# Patient Record
Sex: Male | Born: 1990 | Race: Black or African American | Hispanic: No | Marital: Single | Smoking: Current every day smoker
Health system: Southern US, Community
[De-identification: ages and names within clinical notes are randomized; demographics above are authoritative.]

## PROBLEM LIST (undated history)

## (undated) DIAGNOSIS — T7840XA Allergy, unspecified, initial encounter: Secondary | ICD-10-CM

## (undated) HISTORY — PX: LEG SURGERY: SHX1003

## (undated) HISTORY — DX: Allergy, unspecified, initial encounter: T78.40XA

---
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
## 2012-05-04 ENCOUNTER — Encounter (HOSPITAL_COMMUNITY): Payer: Self-pay | Admitting: Emergency Medicine

## 2012-05-04 DIAGNOSIS — R51 Headache: Secondary | ICD-10-CM | POA: Insufficient documentation

## 2012-05-04 NOTE — ED Notes (Signed)
C/o headache x 4 days.  States he had sore throat 2 days ago but it resolved.

## 2012-05-05 ENCOUNTER — Emergency Department (HOSPITAL_COMMUNITY)
Admission: EM | Admit: 2012-05-05 | Discharge: 2012-05-05 | Disposition: A | Discharge status: Home / Self Care | Payer: 59 | Attending: Emergency Medicine | Admitting: Emergency Medicine

## 2012-05-05 DIAGNOSIS — R51 Headache: Secondary | ICD-10-CM

## 2012-05-05 MED ORDER — KETOROLAC TROMETHAMINE 30 MG/ML IJ SOLN
30.0000 mg | Freq: Once | INTRAMUSCULAR | Status: AC
  Administered 2012-05-05: 30 mg via INTRAVENOUS
  Filled 2012-05-05: qty 1

## 2012-05-05 MED ORDER — TRAMADOL HCL 50 MG PO TABS: 50.0000 mg | ORAL_TABLET | Freq: Three times a day (TID) | ORAL | Status: DC | PRN

## 2012-05-05 NOTE — ED Notes (Signed)
cbg done 75. md aware

## 2012-05-05 NOTE — ED Provider Notes (Signed)
History     CSN: [id number]  Arrival date & time 05/04/12  2109   First MD Initiated Contact with Patient 05/05/12 0046      Chief Complaint  Patient presents with  . Headache    (Consider location/radiation/quality/duration/timing/severity/associated sxs/prior treatment) Patient is a 22 y.o. male presenting with headaches. The history is provided by the patient (the pt complains of a headache today.  no vomiting or visual problems). No language interpreter was used.  Headache Pain location:  Frontal Quality:  Dull Severity currently:  4/10 Severity at highest:  6/10 Onset quality:  Gradual Timing:  Constant Progression:  Unchanged Chronicity:  New Similar to prior headaches: yes   Associated symptoms: no abdominal pain, no back pain, no congestion, no cough, no diarrhea, no fatigue, no seizures and no sinus pressure     History reviewed. No pertinent past medical history.  Past Surgical History  Procedure Laterality Date  . Leg surgery      No family history on file.  History  Substance Use Topics  . Smoking status: Never Smoker   . Smokeless tobacco: Not on file  . Alcohol Use: No      Review of Systems  Constitutional: Negative for fatigue.  HENT: Negative for congestion, sinus pressure and ear discharge.   Eyes: Negative for discharge.  Respiratory: Negative for cough.   Cardiovascular: Negative for chest pain.  Gastrointestinal: Negative for abdominal pain and diarrhea.  Genitourinary: Negative for frequency and hematuria.  Musculoskeletal: Negative for back pain.  Skin: Negative for rash.  Neurological: Positive for headaches. Negative for seizures.  Psychiatric/Behavioral: Negative for hallucinations.    Allergies  Review of patient's allergies indicates no known allergies.  Home Medications   Current Outpatient Rx  Name  Route  Sig  Dispense  Refill  . acetaminophen (TYLENOL) 325 MG tablet   Oral   Take 650 mg by mouth every 6 (six) hours  as needed for pain.         . traMADol (ULTRAM) 50 MG tablet   Oral   Take 1 tablet (50 mg total) by mouth every 8 (eight) hours as needed for pain (headache).   10 tablet   0     BP 160/72  Pulse 65  Temp(Src) 98.3 F (36.8 C) (Oral)  Resp 18  SpO2 98%  Physical Exam  Constitutional: He is oriented to person, place, and time. He appears well-developed.  HENT:  Head: Normocephalic and atraumatic.  Eyes: Conjunctivae and EOM are normal. No scleral icterus.  Neck: Neck supple. No thyromegaly present.  Cardiovascular: Normal rate and regular rhythm.  Exam reveals no gallop and no friction rub.   No murmur heard. Pulmonary/Chest: No stridor. He has no wheezes. He has no rales. He exhibits no tenderness.  Abdominal: He exhibits no distension. There is no tenderness. There is no rebound.  Musculoskeletal: Normal range of motion. He exhibits no edema.  Lymphadenopathy:    He has no cervical adenopathy.  Neurological: He is oriented to person, place, and time. Coordination normal.  Skin: No rash noted. No erythema.  Psychiatric: He has a normal mood and affect. His behavior is normal.    ED Course  Procedures (including critical care time)  Labs Reviewed  GLUCOSE, CAPILLARY   No results found.   1. Headache     Pt improved with tx  MDM          Benny Lennert, MD 05/05/12 [phone]

## 2012-05-05 NOTE — ED Notes (Signed)
PT DC TOHOME.  PT STATES UNDERSTANDING TO DC PAPERWORK.  PT AMBULATORY TO EXIT WITHOUT DIFFICULTY.  PT DENIES NEED FOR W/C.  PT STATES THAT HIS HA IS MUCH BETTER AND WILL FOLLOW UP WITH PCP.

## 2012-07-27 ENCOUNTER — Ambulatory Visit (INDEPENDENT_AMBULATORY_CARE_PROVIDER_SITE_OTHER): Payer: 59 | Admitting: Family Medicine

## 2012-07-27 ENCOUNTER — Encounter: Payer: Self-pay | Admitting: Family Medicine

## 2012-07-27 VITALS — BP 119/77 | HR 62 | Ht 70.0 in | Wt 148.0 lb

## 2012-07-27 DIAGNOSIS — Z7189 Other specified counseling: Secondary | ICD-10-CM

## 2012-07-27 DIAGNOSIS — J309 Allergic rhinitis, unspecified: Secondary | ICD-10-CM

## 2012-07-27 DIAGNOSIS — Z0000 Encounter for general adult medical examination without abnormal findings: Secondary | ICD-10-CM

## 2012-07-27 DIAGNOSIS — J302 Other seasonal allergic rhinitis: Secondary | ICD-10-CM | POA: Insufficient documentation

## 2012-07-27 MED ORDER — LORATADINE 10 MG PO TABS: 10.0000 mg | ORAL_TABLET | Freq: Every day | ORAL | Status: DC

## 2012-07-27 NOTE — Assessment & Plan Note (Signed)
Generally well-appearing male. Plan to f/u in 1 year or earlier as needed. Will f/u on possible feelings of anhedonia as needed. Doubt frank depression or other mood disorder, but will monitor.

## 2012-07-27 NOTE — Assessment & Plan Note (Signed)
A: Pt is a current most-days smoker, and interested in quitting. Not ready to set a definite quit-date. Denies illicit substance use.  P: Provided pt with 1-800-QUIT-NOW number. Will refer for [facility] health coaching. Will f/u as needed, if pt desires pharmaceutical assistance such as gums/patches, etc.

## 2012-07-27 NOTE — Addendum Note (Signed)
Addended by: Bobbye Morton on: 07/27/2012 05:15 PM   Modules accepted: Level of Service

## 2012-07-27 NOTE — Assessment & Plan Note (Signed)
Seasonal in nature, just starting to bother pt now that weather has changed. Recommended regular, daily use of Claritin as this has helped in the past and will likely not cause as much drowsiness as Benadryl. Will f/u as needed and consider addition of nasal saline or something like Flonase.

## 2012-07-27 NOTE — Patient Instructions (Signed)
Thank you for coming in, today! It was nice to meet you. For your allergies, start taking Claritin 10 mg every day.  It is a medicine that works better if you use it every day. You can use it only in the times of year when your allergies bother you.  If your allergies bother you for instance May through September, you can start taking them in April and stop in October. For your smoking, I'm glad you're interested in quitting.  You can call 1-800-QUIT-NOW (303-162-2927). This is the [facility] Stop Smoking Hotline, and is completely free.  I will also send a message to our "health coaches" that you are interested in quitting smoking.  The hotline people and our health coaches can call you and talk about different strategies that might help you quit.  If and when you need, you can make an appointment with me specifically about stopping smoking, as well. Otherwise, you can make an appointment to see me in a year for a well visit, or earlier with any problems as you need. Please feel free to call with any questions or concerns at any time, at [phone]. --Dr. Casper Harrison

## 2012-07-27 NOTE — Progress Notes (Signed)
  Subjective:    Patient ID: Troy Webb, male    DOB: [birth year], 22 y.o.   MRN: [id number]  HPI: Pt presents to clinic for a yearly physical and to discuss his allergies. Generally feels well, without complaints. Pt does not regularly take any medications and reports no known drug allergies.  Seasonal Allergies: Mostly manifests as stuffy nose, rhinorrhea, and sneezing, with some itchy eyes. Mostly in the late spring/summer. Helped by Benadryl (causes drowsiness) and Claritin, but pt does not take these regularly. Symptoms typically not bad in the mornings and then worse through the day as he is outside.  Of note, pt reports some occasional disinterest ("laziness"), in which he "puts off" things that he normally would be interested in. Pt however denies feelings of depression or frank anhedonia, and states he enjoys hobbies, activities with friends, etc without any change in his mood, recently.  PMH, surgical history, FH, SH all reviewed/updated as appropriate.   Pt is a current most-days smoker, ~3 Black-and-Mild's per day. Is interested in quitting but not ready to set a quit date.  Pt endorses weekly EtOH use, perhaps up to 6-7 beers per week, and up to 1 shot of liquor per week.  Pt is not currently sexually active, but has been in the past with male partners.  Review of Systems: As above. Otherwise denies N/V, chills/fever, headache, abdominal pain, change in bowel/bladder habits.     Objective:   Physical Exam BP 119/77  Pulse 62  Ht 5\' 10"  (1.778 m)  Wt 148 lb (67.132 kg)  BMI 21.24 kg/m2 Gen: well-appearing young adult male in NAD; slightly "sweaty," but states he was outside (very hot day) immediately before entering exam room HEENT: [place]/AT, PERRLA, EOMI, posterior oropharynx clear, sclerae and conjunctivae clear, TM's clear bilaterally  Thyroid not enlarged, no cervical lymphadenopathy  Small skin tear to right upper forehead (pt describes a recent burn from a splatter of  grease at work)  No evidence for cellulitis or infection Cardio: RRR, no rub/murmur Pulm: CTAB, no wheezes Abd: soft, nonender, BS+ Ext: warm, well-perfused, no cyanosis/clubbing/edema; distal pulses intact/symmetric     Assessment & Plan:

## 2012-08-09 ENCOUNTER — Telehealth: Payer: Self-pay | Admitting: Home Health Services

## 2012-08-09 NOTE — Telephone Encounter (Signed)
Spoke with Troy Webb Pt reports feeling: good Pt reports taking medications not asked   Smoking: yes, 3-4 black & mild packs per week for 5 years removing cigarettes and smoking materials from environment, stress management and substitution of other forms of reinforcement   This weeks goals: To go from smoking 3-4 black and mild cigars to just 1 a week.  Pt is interested in telephonic health coaching.  Pt's overall goal is: Quit smoking

## 2015-06-13 ENCOUNTER — Encounter (HOSPITAL_COMMUNITY): Payer: Self-pay

## 2015-06-13 ENCOUNTER — Emergency Department (HOSPITAL_COMMUNITY)
Admission: EM | Admit: 2015-06-13 | Discharge: 2015-06-13 | Disposition: A | Discharge status: Home / Self Care | Payer: 59 | Attending: Emergency Medicine | Admitting: Emergency Medicine

## 2015-06-13 DIAGNOSIS — Z79899 Other long term (current) drug therapy: Secondary | ICD-10-CM | POA: Insufficient documentation

## 2015-06-13 DIAGNOSIS — F17210 Nicotine dependence, cigarettes, uncomplicated: Secondary | ICD-10-CM | POA: Diagnosis not present

## 2015-06-13 DIAGNOSIS — J029 Acute pharyngitis, unspecified: Secondary | ICD-10-CM | POA: Diagnosis present

## 2015-06-13 DIAGNOSIS — J039 Acute tonsillitis, unspecified: Secondary | ICD-10-CM | POA: Insufficient documentation

## 2015-06-13 LAB — COMPREHENSIVE METABOLIC PANEL
ALT: 11 U/L — AB (ref 17–63)
ANION GAP: 12 (ref 5–15)
AST: 19 U/L (ref 15–41)
Albumin: 4 g/dL (ref 3.5–5.0)
Alkaline Phosphatase: 42 U/L (ref 38–126)
BUN: 13 mg/dL (ref 6–20)
CHLORIDE: 100 mmol/L — AB (ref 101–111)
CO2: 24 mmol/L (ref 22–32)
Calcium: 9 mg/dL (ref 8.9–10.3)
Creatinine, Ser: 1.27 mg/dL — ABNORMAL HIGH (ref 0.61–1.24)
GFR calc non Af Amer: >60 mL/min (ref >60)
Glucose, Bld: 117 mg/dL — ABNORMAL HIGH (ref 65–99)
POTASSIUM: 3.5 mmol/L (ref 3.5–5.1)
SODIUM: 136 mmol/L (ref 135–145)
Total Bilirubin: 0.9 mg/dL (ref 0.3–1.2)
Total Protein: 7.4 g/dL (ref 6.5–8.1)

## 2015-06-13 LAB — CBC
HCT: 41.5 % (ref 39.0–52.0)
Hemoglobin: 14.2 g/dL (ref 13.0–17.0)
MCH: 31.6 pg (ref 26.0–34.0)
MCHC: 34.2 g/dL (ref 30.0–36.0)
MCV: 92.2 fL (ref 78.0–100.0)
PLATELETS: 200 10*3/uL (ref 150–400)
RBC: 4.5 MIL/uL (ref 4.22–5.81)
RDW: 12.4 % (ref 11.5–15.5)
WBC: 12.4 10*3/uL — ABNORMAL HIGH (ref 4.0–10.5)

## 2015-06-13 LAB — RAPID STREP SCREEN (MED CTR MEBANE ONLY): Streptococcus, Group A Screen (Direct): NEGATIVE

## 2015-06-13 LAB — I-STAT CG4 LACTIC ACID, ED: Lactic Acid, Venous: 0.6 mmol/L (ref 0.5–2.0)

## 2015-06-13 MED ORDER — PENICILLIN V POTASSIUM 250 MG PO TABS
500.0000 mg | ORAL_TABLET | Freq: Once | ORAL | Status: AC
  Administered 2015-06-13: 500 mg via ORAL
  Filled 2015-06-13: qty 2

## 2015-06-13 MED ORDER — IBUPROFEN 800 MG PO TABS
800.0000 mg | ORAL_TABLET | Freq: Once | ORAL | Status: AC
  Administered 2015-06-13: 800 mg via ORAL
  Filled 2015-06-13: qty 1

## 2015-06-13 MED ORDER — PENICILLIN V POTASSIUM 500 MG PO TABS: 500.0000 mg | ORAL_TABLET | Freq: Two times a day (BID) | ORAL | Status: DC

## 2015-06-13 MED ORDER — ACETAMINOPHEN 325 MG PO TABS
ORAL_TABLET | ORAL | Status: AC
  Filled 2015-06-13: qty 2

## 2015-06-13 MED ORDER — ACETAMINOPHEN 325 MG PO TABS
650.0000 mg | ORAL_TABLET | Freq: Once | ORAL | Status: AC | PRN
  Administered 2015-06-13: 650 mg via ORAL

## 2015-06-13 MED ORDER — IBUPROFEN 800 MG PO TABS: 800.0000 mg | ORAL_TABLET | Freq: Three times a day (TID) | ORAL | Status: DC | PRN

## 2015-06-13 NOTE — ED Notes (Signed)
PT ambulatory and independent at discharge.  Wheeled out and driven home by significant other.

## 2015-06-13 NOTE — ED Provider Notes (Signed)
By signing my name below, I, Troy Webb, attest that this documentation has been prepared under the direction and in the presence of [employer], DO. Electronically Signed: Evon Webb, ED Scribe. 06/13/2015. 2:26 AM.  TIME SEEN: 2:20 AM   CHIEF COMPLAINT: Sore throat  HPI: HPI Comments: Troy Webb is a 25 y.o. male who presents to the Emergency Department complaining of sore throat onset 2 days prior. Pt reports associated fever. Pt doesn't report any medications PTA. Pt denies vomiting or diarrhea. Denies recent sick contacts. Has had a mild dry cough. No travel outside the country. No difficulty moving his neck. No headache.   ROS: See HPI Constitutional: no fever  Eyes: no drainage  ENT: no runny nose   Cardiovascular:  no chest pain  Resp: no SOB  GI: no vomiting GU: no dysuria Integumentary: no rash  Allergy: no hives  Musculoskeletal: no leg swelling  Neurological: no slurred speech ROS otherwise negative  PAST MEDICAL HISTORY/PAST SURGICAL HISTORY:  Past Medical History  Diagnosis Date  . Allergy     MEDICATIONS:  Prior to Admission medications   Medication Sig Start Date End Date Taking? Authorizing Provider  acetaminophen (TYLENOL) 325 MG tablet Take 650 mg by mouth every 6 (six) hours as needed for pain.    Historical Provider, MD  loratadine (CLARITIN) 10 MG tablet Take 1 tablet (10 mg total) by mouth daily. 07/27/12   Stephanie Coup Street, MD  traMADol (ULTRAM) 50 MG tablet Take 1 tablet (50 mg total) by mouth every 8 (eight) hours as needed for pain (headache). 05/05/12   Bethann Berkshire, MD    ALLERGIES:  No Known Allergies  SOCIAL HISTORY:  Social History  Substance Use Topics  . Smoking status: Current Every Day Smoker    Types: Cigars  . Smokeless tobacco: Never Used  . Alcohol Use: 4.8 oz/week    7 Cans of beer, 1 Shots of liquor per week     Comment: Beer, occasionally liquor    FAMILY HISTORY: Family History  Problem Relation Age  of Onset  . Diabetes Maternal Aunt   . Diabetes Cousin   . Sickle cell anemia Cousin   . Sickle cell trait Cousin     EXAM: BP 109/69 mmHg  Pulse 96  Temp(Src) 102.8 F (39.3 C) (Oral)  Resp 16  SpO2 99% CONSTITUTIONAL: Alert and oriented and responds appropriately to questions. Well-appearing; well-nourished, febrile HEAD: Normocephalic EYES: Conjunctivae clear, PERRL ENT: normal nose; no rhinorrhea; moist mucous membranes; patient has pharyngeal erythema without petechiae, bilateral mild tonsillar hypertrophy with exudate, no uvular deviation, no trismus or drooling, normal phonation, no stridor, no dental caries or abscess noted, no Ludwig's angina, tongue sits flat in the bottom of the mouth NECK: Supple, no meningismus, no LAD  CARD: RRR; S1 and S2 appreciated; no murmurs, no clicks, no rubs, no gallops RESP: Normal chest excursion without splinting or tachypnea; breath sounds clear and equal bilaterally; no wheezes, no rhonchi, no rales, no hypoxia or respiratory distress, speaking full sentences ABD/GI: Normal bowel sounds; non-distended; soft, non-tender, no rebound, no guarding, no peritoneal signs BACK:  The back appears normal and is non-tender to palpation, there is no CVA tenderness EXT: Normal ROM in all joints; non-tender to palpation; no edema; normal capillary refill; no cyanosis, no calf tenderness or swelling    SKIN: Normal color for age and race; warm; no rash NEURO: Moves all extremities equally, sensation to light touch intact diffusely, cranial nerves II through XII  intact PSYCH: The patient's mood and manner are appropriate. Grooming and personal hygiene are appropriate.  MEDICAL DECISION MAKING: Patient here with tonsillitis. History of test is negative but he has concerning exam. Will treat for bacterial causes. Have offered penicillin injection but he declined stating he would rather have pills. Will discharge on penicillin for the next 10 days. No significant  tonsillar hypertrophy on exam, posterior cervical lymphadenopathy. No sign of deep space neck infection, meningitis, pneumonia. No sign of peritonsillar abscess. Have her committed alternating Tylenol and Motrin. Recommended salt water gargles. Recommended rest and increase fluid intake.   At this time, I do not feel there is any life-threatening condition present. I feel the patient is safe to be discharged home. I have reviewed and discussed all results (EKG, imaging, lab, urine as appropriate), exam findings with patient. I have reviewed nursing notes and appropriate previous records.  I feel the patient is safe to be discharged home without further emergent workup. Discussed usual and customary return precautions. Patient and family (if present) verbalize understanding and are comfortable with this plan.  Patient will follow-up with their primary care provider. If they do not have a primary care provider, information for follow-up has been provided to them. All questions have been answered.   I personally performed the services described in this documentation, which was scribed in my presence. The recorded information has been reviewed and is accurate.    [name], DO 06/13/15 [phone]

## 2015-06-13 NOTE — ED Notes (Signed)
Pt here with c/o sore throat and painful swallowing, onset two days ago. He reports a cough "sometimes."

## 2015-06-13 NOTE — Discharge Instructions (Signed)

## 2015-06-15 LAB — CULTURE, GROUP A STREP (THRC)

## 2015-09-26 ENCOUNTER — Encounter (HOSPITAL_COMMUNITY): Payer: Self-pay | Admitting: Family Medicine

## 2015-09-26 ENCOUNTER — Ambulatory Visit (HOSPITAL_COMMUNITY)
Admission: EM | Admit: 2015-09-26 | Discharge: 2015-09-26 | Disposition: A | Discharge status: Home / Self Care | Payer: 59 | Attending: Radiology | Admitting: Radiology

## 2015-09-26 DIAGNOSIS — R109 Unspecified abdominal pain: Secondary | ICD-10-CM

## 2015-09-26 DIAGNOSIS — R112 Nausea with vomiting, unspecified: Secondary | ICD-10-CM | POA: Diagnosis not present

## 2015-09-26 MED ORDER — OMEPRAZOLE 20 MG PO CPDR: 20.0000 mg | DELAYED_RELEASE_CAPSULE | Freq: Every day | ORAL | Status: DC

## 2015-09-26 NOTE — ED Notes (Signed)
Pt here for mid ab pain with vomiting after drinking last night about 9pm. sts that he drank some water this am and held it down. Denies blood in vomit. sts vomit x 2

## 2015-09-26 NOTE — ED Provider Notes (Signed)
CSN: [id number]     Arrival date & time 09/26/15  1215 History   None    Chief Complaint  Patient presents with  . Abdominal Pain  . Emesis   (Consider location/radiation/quality/duration/timing/severity/associated sxs/prior Treatment) Patient is a 25 y.o. male presenting with abdominal pain and vomiting. The history is provided by the patient.  Abdominal Pain Pain location:  Generalized Pain quality: aching   Pain radiates to:  Does not radiate Pain severity:  Moderate Onset quality:  Sudden Duration:  1 day Timing:  Constant Progression:  Unchanged Chronicity:  Recurrent (X 1 year) Context: alcohol use   Relieved by: pepto bismol temperarily  Exacerbated by: ETOH and drinking soda. Associated symptoms: belching, nausea and vomiting (1 episode this morning)   Associated symptoms: no dysuria   Emesis Associated symptoms: abdominal pain     Past Medical History  Diagnosis Date  . Allergy    Past Surgical History  Procedure Laterality Date  . Leg surgery     Family History  Problem Relation Age of Onset  . Diabetes Maternal Aunt   . Diabetes Cousin   . Sickle cell anemia Cousin   . Sickle cell trait Cousin    Social History  Substance Use Topics  . Smoking status: Current Every Day Smoker    Types: Cigars  . Smokeless tobacco: Never Used  . Alcohol Use: 4.8 oz/week    7 Cans of beer, 1 Shots of liquor per week     Comment: Beer, occasionally liquor    Review of Systems  Constitutional: Negative.   HENT: Negative.   Respiratory: Negative.   Cardiovascular: Negative.   Gastrointestinal: Positive for nausea, vomiting (1 episode this morning) and abdominal pain. Negative for abdominal distention.       Last bowel movement on 7.7.17   Genitourinary: Negative for dysuria and difficulty urinating.    Allergies  Review of patient's allergies indicates no known allergies.  Home Medications   Prior to Admission medications   Medication Sig Start Date End Date  Taking? Authorizing Provider  acetaminophen (TYLENOL) 325 MG tablet Take 650 mg by mouth every 6 (six) hours as needed for pain.    Historical Provider, MD  ibuprofen (ADVIL,MOTRIN) 800 MG tablet Take 1 tablet (800 mg total) by mouth every 8 (eight) hours as needed for mild pain. 06/13/15   Kristen N Ward, DO  loratadine (CLARITIN) 10 MG tablet Take 1 tablet (10 mg total) by mouth daily. 07/27/12   Stephanie Coup Street, MD  penicillin v potassium (VEETID) 500 MG tablet Take 1 tablet (500 mg total) by mouth 2 (two) times daily. 06/13/15   Kristen N Ward, DO  traMADol (ULTRAM) 50 MG tablet Take 1 tablet (50 mg total) by mouth every 8 (eight) hours as needed for pain (headache). 05/05/12   Bethann Berkshire, MD   Meds Ordered and Administered this Visit  Medications - No data to display  BP 132/88 mmHg  Pulse 78  Temp(Src) 98.1 F (36.7 C)  Resp 18  SpO2 98% No data found.   Physical Exam  Constitutional: He appears well-developed and well-nourished.  Cardiovascular: Normal rate and regular rhythm.   Pulmonary/Chest: Effort normal and breath sounds normal.  Abdominal: Soft. Bowel sounds are normal. He exhibits no distension. There is tenderness.  Patient presents with intermittent pain X 1 year. Patient describes the pain as achingPatient reports nausea and 1 episode of vomitting today. Condition is acute on chronic. Condition is made better by pepto bismol and worse  with etoh and soda.     ED Course  Procedures (including critical care time)  Labs Review Labs Reviewed - No data to display  Imaging Review No results found.   Visual Acuity Review  Right Eye Distance:   Left Eye Distance:   Bilateral Distance:    Right Eye Near:   Left Eye Near:    Bilateral Near:         MDM  No diagnosis found. Patient instructed to take 14 days of prilosec and limit known causes is possible   Alene MiresJennifer C Rahaf Carbonell, NP 09/26/15 1308

## 2015-10-02 ENCOUNTER — Ambulatory Visit (HOSPITAL_COMMUNITY)
Admission: EM | Admit: 2015-10-02 | Discharge: 2015-10-02 | Disposition: A | Discharge status: Home / Self Care | Payer: 59 | Attending: Internal Medicine | Admitting: Internal Medicine

## 2015-10-02 ENCOUNTER — Ambulatory Visit (INDEPENDENT_AMBULATORY_CARE_PROVIDER_SITE_OTHER): Payer: 59

## 2015-10-02 ENCOUNTER — Encounter (HOSPITAL_COMMUNITY): Payer: Self-pay | Admitting: Emergency Medicine

## 2015-10-02 DIAGNOSIS — T148 Other injury of unspecified body region: Secondary | ICD-10-CM

## 2015-10-02 DIAGNOSIS — T148XXA Other injury of unspecified body region, initial encounter: Secondary | ICD-10-CM

## 2015-10-02 MED ORDER — IBUPROFEN 800 MG PO TABS
800.0000 mg | ORAL_TABLET | Freq: Three times a day (TID) | ORAL | Status: DC
Start: 2015-10-02 — End: 2016-05-22

## 2015-10-02 MED ORDER — IBUPROFEN 800 MG PO TABS: 800.0000 mg | ORAL_TABLET | Freq: Three times a day (TID) | ORAL | Status: DC

## 2015-10-02 NOTE — ED Provider Notes (Signed)
CSN: [id number]     Arrival date & time 10/02/15  1001 History   First MD Initiated Contact with Patient 10/02/15 1040     Chief Complaint  Patient presents with  . Arm Pain   (Consider location/radiation/quality/duration/timing/severity/associated sxs/prior Treatment) Patient is a 25 y.o. male presenting with arm pain. The history is provided by the patient. No language interpreter was used.  Arm Pain This is a new problem. The current episode started 2 days ago. The problem occurs constantly. The problem has been gradually worsening. Associated symptoms include chest pain. Nothing aggravates the symptoms. He has tried nothing for the symptoms. The treatment provided no relief.  Pt complains of pain in his left side. Pt reports pain is worse when he moves.  Past Medical History  Diagnosis Date  . Allergy    Past Surgical History  Procedure Laterality Date  . Leg surgery     Family History  Problem Relation Age of Onset  . Diabetes Maternal Aunt   . Diabetes Cousin   . Sickle cell anemia Cousin   . Sickle cell trait Cousin    Social History  Substance Use Topics  . Smoking status: Current Every Day Smoker    Types: Cigars  . Smokeless tobacco: Never Used  . Alcohol Use: 4.8 oz/week    7 Cans of beer, 1 Shots of liquor per week     Comment: Beer, occasionally liquor    Review of Systems  Cardiovascular: Positive for chest pain.  All other systems reviewed and are negative.   Allergies  Review of patient's allergies indicates no known allergies.  Home Medications   Prior to Admission medications   Medication Sig Start Date End Date Taking? Authorizing Provider  omeprazole (PRILOSEC) 20 MG capsule Take 1 capsule (20 mg total) by mouth daily. 09/26/15  Yes Alene Mires, NP  acetaminophen (TYLENOL) 325 MG tablet Take 650 mg by mouth every 6 (six) hours as needed for pain.    Historical Provider, MD  ibuprofen (ADVIL,MOTRIN) 800 MG tablet Take 1 tablet (800 mg  total) by mouth 3 (three) times daily. 10/02/15   Elson Areas, PA-C  loratadine (CLARITIN) 10 MG tablet Take 1 tablet (10 mg total) by mouth daily. 07/27/12   Stephanie Coup Street, MD  penicillin v potassium (VEETID) 500 MG tablet Take 1 tablet (500 mg total) by mouth 2 (two) times daily. 06/13/15   Kristen N Ward, DO  traMADol (ULTRAM) 50 MG tablet Take 1 tablet (50 mg total) by mouth every 8 (eight) hours as needed for pain (headache). 05/05/12   Bethann Berkshire, MD   Meds Ordered and Administered this Visit  Medications - No data to display  BP 132/75 mmHg  Pulse 55  Temp(Src) 98 F (36.7 C) (Oral)  Resp 16  SpO2 100% No data found.   Physical Exam  Constitutional: He is oriented to person, place, and time. He appears well-developed and well-nourished.  HENT:  Head: Normocephalic and atraumatic.  Right Ear: External ear normal.  Eyes: Conjunctivae and EOM are normal.  Neck: Normal range of motion.  Cardiovascular: Normal rate.   Pulmonary/Chest: Effort normal.  Tender left lateral chest wall.  Pain with movement.   Abdominal: He exhibits no distension.  Musculoskeletal: Normal range of motion.  Neurological: He is alert and oriented to person, place, and time.  Psychiatric: He has a normal mood and affect.  Nursing note and vitals reviewed.   ED Course  Procedures (including critical care time)  Labs Review Labs Reviewed - No data to display  Imaging Review Dg Chest 2 View  10/02/2015  CLINICAL DATA:  Left-sided chest pain under the axilla. EXAM: CHEST  2 VIEW COMPARISON:  None. FINDINGS: The heart size and mediastinal contours are within normal limits. Both lungs are clear. The visualized skeletal structures are unremarkable. IMPRESSION: No active cardiopulmonary disease. Electronically Signed   By: Elige KoHetal  Patel   On: 10/02/2015 11:38     Visual Acuity Review  Right Eye Distance:   Left Eye Distance:   Bilateral Distance:    Right Eye Near:   Left Eye Near:     Bilateral Near:         MDM Pain seems to be muscular.  Pt advised to return if not improving.  Ibuprofen oow until Monday   1. Muscle strain    Meds ordered this encounter  Medications  . DISCONTD: ibuprofen (ADVIL,MOTRIN) 800 MG tablet    Sig: Take 1 tablet (800 mg total) by mouth 3 (three) times daily.    Dispense:  21 tablet    Refill:  0    Order Specific Question:  Supervising Provider    Answer:  Eustace MooreMURRAY, LAURA W [[id number]]  . ibuprofen (ADVIL,MOTRIN) 800 MG tablet    Sig: Take 1 tablet (800 mg total) by mouth 3 (three) times daily.    Dispense:  21 tablet    Refill:  0    Order Specific Question:  Supervising Provider    Answer:  Eustace MooreMURRAY, LAURA W [[id number]]  An After Visit Summary was printed and given to the patient.    Lonia SkinnerLeslie K BerlinSofia, PA-C 10/02/15 1203

## 2015-10-02 NOTE — ED Notes (Signed)
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 NAD

## 2015-10-02 NOTE — Discharge Instructions (Signed)

## 2016-05-22 ENCOUNTER — Emergency Department (INDEPENDENT_AMBULATORY_CARE_PROVIDER_SITE_OTHER)
Admission: EM | Admit: 2016-05-22 | Discharge: 2016-05-22 | Disposition: A | Discharge status: Home / Self Care | Payer: 59 | Source: Home / Self Care | Attending: Family Medicine | Admitting: Family Medicine

## 2016-05-22 ENCOUNTER — Emergency Department (INDEPENDENT_AMBULATORY_CARE_PROVIDER_SITE_OTHER): Payer: 59

## 2016-05-22 ENCOUNTER — Encounter: Payer: Self-pay | Admitting: Emergency Medicine

## 2016-05-22 DIAGNOSIS — M79671 Pain in right foot: Secondary | ICD-10-CM

## 2016-05-22 DIAGNOSIS — S93601A Unspecified sprain of right foot, initial encounter: Secondary | ICD-10-CM

## 2016-05-22 DIAGNOSIS — M25471 Effusion, right ankle: Secondary | ICD-10-CM

## 2016-05-22 MED ORDER — HYDROCODONE-ACETAMINOPHEN 5-325 MG PO TABS: ORAL_TABLET | ORAL | 0 refills | Status: DC

## 2016-05-22 NOTE — ED Provider Notes (Signed)
[facility]    CSN: [id number] Arrival date & time: 05/22/16  1640     History   Chief Complaint Chief Complaint  Patient presents with  . Foot Injury    HPI Troy Webb is a 26 y.o. male.   Patient stepped off a curb yesterday, twisting his right foot/ankle.  He has had persistent pain with weight bearing.   The history is provided by the patient.  Foot Injury  Location:  Foot Time since incident:  1 day Injury: yes   Mechanism of injury comment:  Twisted foot stepping off a curb Foot location:  Dorsum of R foot Pain details:    Quality:  Aching   Radiates to:  Does not radiate   Severity:  Moderate   Onset quality:  Sudden   Duration:  1 day   Timing:  Constant   Progression:  Unchanged Chronicity:  New Prior injury to area:  No Relieved by:  Nothing Worsened by:  Bearing weight Ineffective treatments:  None tried Associated symptoms: decreased ROM, stiffness and swelling   Associated symptoms: no muscle weakness, no numbness and no tingling     Past Medical History:  Diagnosis Date  . Allergy     Patient Active Problem List   Diagnosis Date Noted  . Health maintenance examination 07/27/2012  . Counseling on substance use and abuse 07/27/2012  . Seasonal allergies 07/27/2012    Past Surgical History:  Procedure Laterality Date  . LEG SURGERY         Home Medications    Prior to Admission medications   Medication Sig Start Date End Date Taking? Authorizing Provider  acetaminophen (TYLENOL) 325 MG tablet Take 650 mg by mouth every 6 (six) hours as needed for pain.    Historical Provider, MD  HYDROcodone-acetaminophen (NORCO/VICODIN) 5-325 MG tablet Take one by mouth at bedtime as needed for pain 05/22/16   Lattie Haw, MD    Family History Family History  Problem Relation Age of Onset  . Diabetes Maternal Aunt   . Diabetes Cousin   . Sickle cell anemia Cousin   . Sickle cell trait Cousin     Social History Social  History  Substance Use Topics  . Smoking status: Current Every Day Smoker    Types: Cigars  . Smokeless tobacco: Never Used  . Alcohol use 4.8 oz/week    7 Cans of beer, 1 Shots of liquor per week     Comment: Beer, occasionally liquor     Allergies   Patient has no known allergies.   Review of Systems Review of Systems  Musculoskeletal: Positive for stiffness.  All other systems reviewed and are negative.    Physical Exam Triage Vital Signs ED Triage Vitals  Enc Vitals Group     BP 05/22/16 1650 126/89     Pulse Rate 05/22/16 1650 83     Resp --      Temp 05/22/16 1650 98.5 F (36.9 C)     Temp Source 05/22/16 1650 Oral     SpO2 05/22/16 1650 98 %     Weight 05/22/16 1650 145 lb (65.8 kg)     Height 05/22/16 1650 6' (1.829 m)     Head Circumference --      Peak Flow --      Pain Score 05/22/16 1652 8     Pain Loc --      Pain Edu? --      Excl. in GC? --  No data found.   Updated Vital Signs BP 126/89 (BP Location: Left Arm)   Pulse 83   Temp 98.5 F (36.9 C) (Oral)   Ht 6' (1.829 m)   Wt 145 lb (65.8 kg)   SpO2 98%   BMI 19.67 kg/m   Visual Acuity Right Eye Distance:   Left Eye Distance:   Bilateral Distance:    Right Eye Near:   Left Eye Near:    Bilateral Near:     Physical Exam  Constitutional: He appears well-developed and well-nourished. No distress.  HENT:  Head: Atraumatic.  Eyes: Pupils are equal, round, and reactive to light.  Neck: Normal range of motion.  Cardiovascular: Normal rate.   Pulmonary/Chest: Effort normal.  Musculoskeletal:       Right foot: There is decreased range of motion, tenderness, bony tenderness and swelling. There is normal capillary refill.       Feet:   Right ankle:  Decreased range of motion.  Tenderness and swelling over the dorsal/lateral foot  as noted on diagram.  There is tenderness over the medial ankle just anterior to the medial malleolus.  Ankle joint stable.  No tenderness over the base of  the fifth metatarsal.  Distal neurovascular function is intact.   Neurological: He is alert.  Skin: Skin is warm and dry.  Nursing note and vitals reviewed.    UC Treatments / Results  Labs (all labs ordered are listed, but only abnormal results are displayed) Labs Reviewed - No data to display  EKG  EKG Interpretation None       Radiology Dg Ankle Complete Right  Result Date: 05/22/2016 CLINICAL DATA:  Pt states he stepped off a curb last night and twisted his right foot/right ankle. C/o anterior and lateral pain with swelling and is unable to bear weight. EXAM: RIGHT ANKLE - COMPLETE 3+ VIEW COMPARISON:  None. FINDINGS: Osseous alignment is normal. No fracture line or displaced fracture fragment. Ankle mortise is symmetric. Soft tissue swelling about the right ankle. IMPRESSION: Soft tissue swelling.  No osseous fracture or dislocation seen. Electronically Signed   By: Bary RichardStan  Maynard M.D.   On: 05/22/2016 17:20   Dg Foot Complete Right  Result Date: 05/22/2016 CLINICAL DATA:  Pt states he stepped off a curb last night and twisted his right foot/right ankle. C/o anterior and lateral pain with swelling and is unable to bear weight. EXAM: RIGHT FOOT COMPLETE - 3+ VIEW COMPARISON:  None. FINDINGS: There is no evidence of fracture or dislocation. There is no evidence of arthropathy or other focal bone abnormality. Soft tissues are unremarkable. IMPRESSION: Negative. Electronically Signed   By: Bary RichardStan  Maynard M.D.   On: 05/22/2016 17:19    Procedures Procedures (including critical care time)  Medications Ordered in UC Medications - No data to display   Initial Impression / Assessment and Plan / UC Course  I have reviewed the triage vital signs and the nursing notes.  Pertinent labs & imaging results that were available during my care of the patient were reviewed by me and considered in my medical decision making (see chart for details).    Ace wrap applied.  Dispensed cam walker and  crutches.  Rx for Lortab at bedtime. Apply ice pack for 30 minutes every 1 to 2 hours today and tomorrow.  Elevate.  Use crutches for 3 to 5 days.  Wear Ace wrap until swelling decreases.  Wear brace for about 2 to 3 weeks.  Begin range of motion and  stretching exercises in about 5 days as per instruction sheet.  May take Ibuprofen 200mg , 4 tabs every 8 hours with food.  Followup with Dr. Rodney Langton or Dr. Clement[facility] ([facility]) if not improving about two weeks.     Final Clinical Impressions(s) / UC Diagnoses   Final diagnoses:  Right foot sprain, initial encounter    New Prescriptions New Prescriptions   HYDROCODONE-ACETAMINOPHEN (NORCO/VICODIN) 5-325 MG TABLET    Take one by mouth at bedtime as needed for pain     Lattie Haw, MD 05/24/16 1719

## 2016-05-22 NOTE — Discharge Instructions (Signed)
Apply ice pack for 30 minutes every 1 to 2 hours today and tomorrow.  Elevate.  Use crutches for 3 to 5 days.  Wear Ace wrap until swelling decreases.  Wear brace for about 2 to 3 weeks.  Begin range of motion and stretching exercises in about 5 days as per instruction sheet.  May take Ibuprofen 200mg, 4 tabs every 8 hours with food.  ° °

## 2016-05-22 NOTE — ED Triage Notes (Signed)
Pt injured his right foot and ankle yesterday.  Pt stepped off of a curb, pain and swelling.

## 2018-12-11 ENCOUNTER — Emergency Department (HOSPITAL_COMMUNITY): Payer: 59

## 2018-12-11 ENCOUNTER — Encounter (HOSPITAL_COMMUNITY): Payer: Self-pay | Admitting: Emergency Medicine

## 2018-12-11 ENCOUNTER — Emergency Department (HOSPITAL_COMMUNITY)
Admission: EM | Admit: 2018-12-11 | Discharge: 2018-12-11 | Disposition: A | Discharge status: Home / Self Care | Payer: 59 | Attending: Emergency Medicine | Admitting: Emergency Medicine

## 2018-12-11 DIAGNOSIS — F17200 Nicotine dependence, unspecified, uncomplicated: Secondary | ICD-10-CM | POA: Diagnosis not present

## 2018-12-11 DIAGNOSIS — R0789 Other chest pain: Secondary | ICD-10-CM | POA: Diagnosis not present

## 2018-12-11 DIAGNOSIS — R079 Chest pain, unspecified: Secondary | ICD-10-CM | POA: Diagnosis present

## 2018-12-11 LAB — TROPONIN I (HIGH SENSITIVITY)
Troponin I (High Sensitivity): 5 ng/L (ref <18)
Troponin I (High Sensitivity): 6 ng/L (ref <18)

## 2018-12-11 LAB — CBC
HCT: 42.6 % (ref 39.0–52.0)
Hemoglobin: 14.2 g/dL (ref 13.0–17.0)
MCH: 32.4 pg (ref 26.0–34.0)
MCHC: 33.3 g/dL (ref 30.0–36.0)
MCV: 97.3 fL (ref 80.0–100.0)
Platelets: 240 10*3/uL (ref 150–400)
RBC: 4.38 MIL/uL (ref 4.22–5.81)
RDW: 12.6 % (ref 11.5–15.5)
WBC: 7.5 10*3/uL (ref 4.0–10.5)
nRBC: 0 % (ref 0.0–0.2)

## 2018-12-11 LAB — BASIC METABOLIC PANEL
Anion gap: 9 (ref 5–15)
BUN: 15 mg/dL (ref 6–20)
CO2: 26 mmol/L (ref 22–32)
Calcium: 9.3 mg/dL (ref 8.9–10.3)
Chloride: 105 mmol/L (ref 98–111)
Creatinine, Ser: 1.13 mg/dL (ref 0.61–1.24)
GFR calc Af Amer: >60 mL/min (ref >60)
GFR calc non Af Amer: >60 mL/min (ref >60)
Glucose, Bld: 83 mg/dL (ref 70–99)
Potassium: 4.1 mmol/L (ref 3.5–5.1)
Sodium: 140 mmol/L (ref 135–145)

## 2018-12-11 MED ORDER — SODIUM CHLORIDE 0.9% FLUSH: 3.0000 mL | Freq: Once | INTRAVENOUS | Status: DC

## 2018-12-11 NOTE — Discharge Instructions (Signed)
Take 4 over the counter ibuprofen tablets 3 times a day or 2 over-the-counter naproxen tablets twice a day for pain. Also take tylenol 1000mg(2 extra strength) four times a day.    

## 2018-12-11 NOTE — ED Provider Notes (Signed)
[facility] Provider Note   CSN: [id number] Arrival date & time: 12/11/18  1308     History   Chief Complaint No chief complaint on file.   HPI WILL HEINKEL is a 28 y.o. male.     28 yo M with a chief complaints of chest pain.  This is left-sided and pinpoint.  Going on for the past couple days.  Is worse with deep breathing twisting turning or lifting things over his head.  He denies trauma to the area denies history of PE or DVT denies hemoptysis, unilateral lower extremity edema recent surgery or immobilization.  He denies hypertension hyperlipidemia diabetes or family history of MI.  He does smoke occasionally.  Describes the pain is sharp nonradiating.  The history is provided by the patient.  Chest Pain Pain location:  L chest Pain quality: sharp and shooting   Pain radiates to:  Does not radiate Pain severity:  Moderate Onset quality:  Gradual Duration:  2 days Timing:  Constant Progression:  Worsening Chronicity:  New Relieved by:  Nothing Worsened by:  Deep breathing and movement Ineffective treatments:  None tried Associated symptoms: no abdominal pain, no fever, no headache, no palpitations, no shortness of breath and no vomiting     Past Medical History:  Diagnosis Date  . Allergy     Patient Active Problem List   Diagnosis Date Noted  . Health maintenance examination 07/27/2012  . Counseling on substance use and abuse 07/27/2012  . Seasonal allergies 07/27/2012    Past Surgical History:  Procedure Laterality Date  . LEG SURGERY          Home Medications    Prior to Admission medications   Medication Sig Start Date End Date Taking? Authorizing Provider  acetaminophen (TYLENOL) 325 MG tablet Take 650 mg by mouth every 6 (six) hours as needed for pain.    [provider]  HYDROcodone-acetaminophen (NORCO/VICODIN) 5-325 MG tablet Take one by mouth at bedtime as needed for pain 05/22/16   Kandra Nicolas, MD    Family History Family History  Problem Relation Age of Onset  . Diabetes Maternal Aunt   . Diabetes Cousin   . Sickle cell anemia Cousin   . Sickle cell trait Cousin     Social History Social History   Tobacco Use  . Smoking status: Current Every Day Smoker    Types: Cigars  . Smokeless tobacco: Never Used  Substance Use Topics  . Alcohol use: Yes    Alcohol/week: 8.0 standard drinks    Types: 7 Cans of beer, 1 Shots of liquor per week    Comment: Beer, occasionally liquor  . Drug use: No     Allergies   Patient has no known allergies.   Review of Systems Review of Systems  Constitutional: Negative for chills and fever.  HENT: Negative for congestion and facial swelling.   Eyes: Negative for discharge and visual disturbance.  Respiratory: Negative for shortness of breath.   Cardiovascular: Positive for chest pain. Negative for palpitations.  Gastrointestinal: Negative for abdominal pain, diarrhea and vomiting.  Musculoskeletal: Negative for arthralgias and myalgias.  Skin: Negative for color change and rash.  Neurological: Negative for tremors, syncope and headaches.  Psychiatric/Behavioral: Negative for confusion and dysphoric mood.     Physical Exam Updated Vital Signs BP 124/80 (BP Location: Left Arm)   Pulse 80   Temp 98.5 F (36.9 C) (Oral)   Resp 16   SpO2  98%   Physical Exam Vitals signs and nursing note reviewed.  Constitutional:      Appearance: He is well-developed.  HENT:     Head: Normocephalic and atraumatic.  Eyes:     Pupils: Pupils are equal, round, and reactive to light.  Neck:     Musculoskeletal: Normal range of motion and neck supple.     Vascular: No JVD.  Cardiovascular:     Rate and Rhythm: Normal rate and regular rhythm.     Heart sounds: No murmur. No friction rub. No gallop.   Pulmonary:     Effort: No respiratory distress.     Breath sounds: No wheezing.  Abdominal:     General: There is no  distension.     Tenderness: There is no guarding or rebound.  Musculoskeletal: Normal range of motion.        General: Tenderness present.     Comments: Pain is reproduced with palpation of the left chest wall.  Skin:    Coloration: Skin is not pale.     Findings: No rash.  Neurological:     Mental Status: He is alert and oriented to person, place, and time.  Psychiatric:        Behavior: Behavior normal.      ED Treatments / Results  Labs (all labs ordered are listed, but only abnormal results are displayed) Labs Reviewed  BASIC METABOLIC PANEL  CBC  TROPONIN I (HIGH SENSITIVITY)  TROPONIN I (HIGH SENSITIVITY)    EKG EKG Interpretation  Date/Time:  Tuesday December 11 2018 13:53:11 EDT Ventricular Rate:  59 PR Interval:  146 QRS Duration: 88 QT Interval:  382 QTC Calculation: 378 R Axis:   90 Text Interpretation:   Poor data quality, interpretation may be adversely affected Sinus bradycardia Rightward axis Pulmonary disease pattern Abnormal ECG No old tracing to compare Confirmed by Melene Plan [phone]) on 12/11/2018 6:16:29 PM   Radiology Dg Chest 2 View  Result Date: 12/11/2018 CLINICAL DATA:  Chest pain and shortness of breath EXAM: CHEST - 2 VIEW COMPARISON:  October 02, 2015 FINDINGS: The lungs are clear. Heart size and pulmonary vascularity are normal. No adenopathy. No pneumothorax. No bone lesions. IMPRESSION: No edema or consolidation. Electronically Signed   By: Bretta Bang III M.D.   On: 12/11/2018 14:54    Procedures Procedures (including critical care time)  Medications Ordered in ED Medications  sodium chloride flush (NS) 0.9 % injection 3 mL (has no administration in time range)     Initial Impression / Assessment and Plan / ED Course  I have reviewed the triage vital signs and the nursing notes.  Pertinent labs & imaging results that were available during my care of the patient were reviewed by me and considered in my medical decision making  (see chart for details).        28 yo M with chest pain.  Atypical in nature.  Reproduced on exam.  Most likely this is musculoskeletal.  EKG with no concerning finding for me.  Had lab work performed in triage that is unremarkable.  Not anemic no electrolyte abnormality his troponin is negative.  Chest x-ray viewed by me without focal overtreated or pneumothorax.  At this point his pain is most likely musculoskeletal.  Will discharge patient home.  6:51 PM:  I have discussed the diagnosis/risks/treatment options with the patient and friend and believe the pt to be eligible for discharge home to follow-up with PCP. We also discussed returning to the  ED immediately if new or worsening sx occur. We discussed the sx which are most concerning (e.g., sudden worsening pain, fever, inability to tolerate by mouth, syncope) that necessitate immediate return. Medications administered to the patient during their visit and any new prescriptions provided to the patient are listed below.  Medications given during this visit Medications  sodium chloride flush (NS) 0.9 % injection 3 mL (has no administration in time range)     The patient appears reasonably screen and/or stabilized for discharge and I doubt any other medical condition or other [facility] requiring further screening, evaluation, or treatment in the ED at this time prior to discharge.    Final Clinical Impressions(s) / ED Diagnoses   Final diagnoses:  Chest wall pain    ED Discharge Orders    None       Melene Plan, DO 12/11/18 1851

## 2018-12-11 NOTE — ED Notes (Signed)
Patient verbalizes understanding of discharge instructions. Opportunity for questioning and answers were provided. Armband removed by staff, pt discharged from ED.  

## 2018-12-11 NOTE — ED Triage Notes (Signed)
Pt sent [facility] from [facility] with [facility] abnormal ekg , pt is c/o chest pain on the left side non radiating , pt is a smoker

## 2019-04-16 ENCOUNTER — Encounter: Payer: Self-pay | Admitting: Emergency Medicine

## 2019-04-16 ENCOUNTER — Ambulatory Visit
Admission: EM | Admit: 2019-04-16 | Discharge: 2019-04-16 | Disposition: A | Discharge status: Home / Self Care | Payer: 59 | Attending: Emergency Medicine | Admitting: Emergency Medicine

## 2019-04-16 ENCOUNTER — Other Ambulatory Visit: Payer: Self-pay

## 2019-04-16 DIAGNOSIS — H60312 Diffuse otitis externa, left ear: Secondary | ICD-10-CM | POA: Diagnosis not present

## 2019-04-16 DIAGNOSIS — R0982 Postnasal drip: Secondary | ICD-10-CM | POA: Diagnosis not present

## 2019-04-16 DIAGNOSIS — J302 Other seasonal allergic rhinitis: Secondary | ICD-10-CM

## 2019-04-16 MED ORDER — FLUTICASONE PROPIONATE 50 MCG/ACT NA SUSP: 1.0000 | Freq: Every day | NASAL | 0 refills | Status: DC

## 2019-04-16 MED ORDER — NEOMYCIN-POLYMYXIN-HC 3.5-10000-1 OT SOLN: 3.0000 [drp] | Freq: Three times a day (TID) | OTIC | 0 refills | Status: DC

## 2019-04-16 MED ORDER — LORATADINE 10 MG PO TABS: 10.0000 mg | ORAL_TABLET | Freq: Every day | ORAL | 0 refills | Status: DC

## 2019-04-16 NOTE — Discharge Instructions (Signed)
Recommend RICE: rest, ice, compression, elevation as needed for pain.   Cold therapy (ice packs) can be used to help swelling both after injury and after prolonged use of areas of chronic pain/aches.  For pain: recommend 350 mg-1000 mg of Tylenol (acetaminophen) and/or 200 mg - 800 mg of Advil (ibuprofen, Motrin) every 8 hours as needed.  May alternate between the two throughout the day as they are generally safe to take together.  DO NOT exceed more than 3000 mg of Tylenol or 3200 mg of ibuprofen in a 24 hour period as this could damage your stomach, kidneys, liver, or increase your bleeding risk. 

## 2019-04-16 NOTE — ED Triage Notes (Signed)
Pt presents to [facility] for assessment of 2 days of left ear pain.  Denies nasal congestion or cough, c/o "junk in my throat" x 2-3 months.

## 2019-04-16 NOTE — ED Provider Notes (Signed)
[facility]    CSN: [id number] Arrival date & time: 04/16/19  1529      History   Chief Complaint Chief Complaint  Patient presents with  . Otalgia    HPI Troy Webb is a 29 y.o. male with history of seasonal allergies presenting for 2-day course of left ear pain.  Denies decreased hearing, fever, headache, change in vision.  States that he does cough up "junk in my throat "for last 2 to 3 months.  Patient notes he is not currently take anything for symptoms, nor is he taking anything for seasonal allergies.  Denies cough, shortness of breath, chest pain, known sick contacts.   Past Medical History:  Diagnosis Date  . Allergy     Patient Active Problem List   Diagnosis Date Noted  . Health maintenance examination 07/27/2012  . Counseling on substance use and abuse 07/27/2012  . Seasonal allergies 07/27/2012    Past Surgical History:  Procedure Laterality Date  . LEG SURGERY         Home Medications    Prior to Admission medications   Medication Sig Start Date End Date Taking? Authorizing Provider  acetaminophen (TYLENOL) 325 MG tablet Take 650 mg by mouth every 6 (six) hours as needed for pain.    [provider]  fluticasone (FLONASE) 50 MCG/ACT nasal spray Place 1 spray into both nostrils daily. 04/16/19   Hall-Potvin, [place], PA-C  loratadine (CLARITIN) 10 MG tablet Take 1 tablet (10 mg total) by mouth daily. 04/16/19   Hall-Potvin, [place], PA-C  neomycin-polymyxin-hydrocortisone (CORTISPORIN) OTIC solution Place 3 drops into the left ear 3 (three) times daily. 04/16/19   Hall-Potvin, [place], PA-C    Family History Family History  Problem Relation Age of Onset  . Diabetes Maternal Aunt   . Diabetes Cousin   . Sickle cell anemia Cousin   . Sickle cell trait Cousin     Social History Social History   Tobacco Use  . Smoking status: Current Every Day Smoker    Types: Cigars  . Smokeless tobacco: Never Used  Substance Use  Topics  . Alcohol use: Yes    Alcohol/week: 8.0 standard drinks    Types: 7 Cans of beer, 1 Shots of liquor per week    Comment: Beer, occasionally liquor  . Drug use: No     Allergies   Patient has no known allergies.   Review of Systems As per HPI   Physical Exam Triage Vital Signs ED Triage Vitals [04/16/19 1537]  Enc Vitals Group     BP 133/85     Pulse Rate 60     Resp 16     Temp 98.7 F (37.1 C)     Temp Source Temporal     SpO2 98 %     Weight      Height      Head Circumference      Peak Flow      Pain Score 8     Pain Loc      Pain Edu?      Excl. in GC?    No data found.  Updated Vital Signs BP 133/85 (BP Location: Left Arm)   Pulse 60   Temp 98.7 F (37.1 C) (Temporal)   Resp 16   SpO2 98%   Visual Acuity Right Eye Distance:   Left Eye Distance:   Bilateral Distance:    Right Eye Near:   Left Eye Near:    Bilateral Near:  Physical Exam Constitutional:      General: He is not in acute distress. HENT:     Head: Normocephalic and atraumatic.     Right Ear: Tympanic membrane, ear canal and external ear normal.     Left Ear: Tympanic membrane normal.     Ears:     Comments: Left ear with mild tragal tenderness, no tragal tenderness.  Left EAC area with moderate injection sparing TM.  TMs without perforation or bulging.  Bony landmarks intact.  No discharge, foreign body, or blood in canal    Nose: No nasal deformity, congestion or rhinorrhea.     Comments: Turbinates moderately edematous bilaterally with mucosal pallor    Mouth/Throat:     Mouth: Mucous membranes are moist.     Tongue: Tongue does not deviate from midline.     Pharynx: Oropharynx is clear. Uvula midline.     Comments: Cobblestoning present.  No tonsillar hypertrophy or exudate Eyes:     General: No scleral icterus.    Conjunctiva/sclera: Conjunctivae normal.     Pupils: Pupils are equal, round, and reactive to light.  Cardiovascular:     Rate and Rhythm: Normal  rate.  Pulmonary:     Effort: Pulmonary effort is normal. No respiratory distress.     Breath sounds: No wheezing.  Musculoskeletal:     Cervical back: Normal range of motion and neck supple. No tenderness. No muscular tenderness.  Lymphadenopathy:     Cervical: No cervical adenopathy.  Neurological:     Mental Status: He is alert.      UC Treatments / Results  Labs (all labs ordered are listed, but only abnormal results are displayed) Labs Reviewed - No data to display  EKG   Radiology No results found.  Procedures Procedures (including critical care time)  Medications Ordered in UC Medications - No data to display  Initial Impression / Assessment and Plan / UC Course  I have reviewed the triage vital signs and the nursing notes.  Pertinent labs & imaging results that were available during my care of the patient were reviewed by me and considered in my medical decision making (see chart for details).     Patient afebrile, nontoxic in office today.  Left ear with EAC injection and mild tragal tenderness concerning for forming otitis externa, though other symptoms likely related to seasonal allergies/postnasal drip.  Will begin Cortisporin drops and supportive management as outlined below.  Return precautions discussed, patient verbalized understanding and is agreeable to plan. Final Clinical Impressions(s) / UC Diagnoses   Final diagnoses:  Acute diffuse otitis externa of left ear  Seasonal allergies  Post-nasal drip     Discharge Instructions     Recommend RICE: rest, ice, compression, elevation as needed for pain.  Cold therapy (ice packs) can be used to help swelling both after injury and after prolonged use of areas of chronic pain/aches.  For pain: recommend 350 mg-1000 mg of Tylenol (acetaminophen) and/or 200 mg - 800 mg of Advil (ibuprofen, Motrin) every 8 hours as needed.  May alternate between the two throughout the day as they are generally safe to take  together.  DO NOT exceed more than 3000 mg of Tylenol or 3200 mg of ibuprofen in a 24 hour period as this could damage your stomach, kidneys, liver, or increase your bleeding risk.    ED Prescriptions    Medication Sig Dispense Auth. Provider   neomycin-polymyxin-hydrocortisone (CORTISPORIN) OTIC solution Place 3 drops into the left ear 3 (  three) times daily. 10 mL Hall-Potvin, [place], PA-C   loratadine (CLARITIN) 10 MG tablet Take 1 tablet (10 mg total) by mouth daily. 30 tablet Hall-Potvin, [place], PA-C   fluticasone (FLONASE) 50 MCG/ACT nasal spray Place 1 spray into both nostrils daily. 16 g Hall-Potvin, [place], PA-C     PDMP not reviewed this encounter.   Hall-Potvin, [place], [place] 04/18/19 1450

## 2019-06-11 ENCOUNTER — Encounter: Payer: Self-pay | Admitting: Emergency Medicine

## 2019-06-11 ENCOUNTER — Other Ambulatory Visit: Payer: Self-pay

## 2019-06-11 ENCOUNTER — Ambulatory Visit
Admission: EM | Admit: 2019-06-11 | Discharge: 2019-06-11 | Disposition: A | Discharge status: Home / Self Care | Payer: 59 | Attending: Emergency Medicine | Admitting: Emergency Medicine

## 2019-06-11 DIAGNOSIS — R0781 Pleurodynia: Secondary | ICD-10-CM | POA: Diagnosis not present

## 2019-06-11 MED ORDER — NAPROXEN 500 MG PO TABS: 500.0000 mg | ORAL_TABLET | Freq: Two times a day (BID) | ORAL | 0 refills | Status: DC

## 2019-06-11 NOTE — ED Triage Notes (Signed)
Pt presents to [facility] for assessment of RUQ/right rib pain x 1 week, intermittent in nature, seems to be worse when he does heavy lifting at work.  Patient denies recent cough, c/o increase of pain with deep inspiration as well.

## 2019-06-11 NOTE — ED Notes (Signed)
Patient able to ambulate independently  

## 2019-06-11 NOTE — Discharge Instructions (Signed)
Recommend RICE: rest, ice, compression, elevation as needed for pain.    Heat therapy (hot compress, warm wash red, hot showers, etc.) can help relax muscles and soothe muscle aches. Cold therapy (ice packs) can be used to help swelling both after injury and after prolonged use of areas of chronic pain/aches.  For pain: recommend 350 mg-1000 mg of Tylenol (acetaminophen) and/or 200 mg - 800 mg of Advil (ibuprofen, Motrin) every 8 hours as needed.  May alternate between the two throughout the day as they are generally safe to take together.  DO NOT exceed more than 3000 mg of Tylenol or 3200 mg of ibuprofen in a 24 hour period as this could damage your stomach, kidneys, liver, or increase your bleeding risk.  May take muscle relaxer as needed for severe pain / spasm.  (This medication may cause you to become tired so it is important you do not drink alcohol or operate heavy machinery while on this medication.  Recommend your first dose to be taken before bedtime to monitor for side effects safely)  Return for worsening pain, bruising, difficulty breathing, lightheadedness.

## 2019-06-11 NOTE — ED Provider Notes (Signed)
[facility]    CSN: [id number] Arrival date & time: 06/11/19  1416      History   Chief Complaint Chief Complaint  Patient presents with  . Chest Pain    HPI Troy Webb is a 29 y.o. male with history of allergies presenting for right lower costal pain.  States pain is sharp, intermittent in nature for the last week, though became constant last night.  Patient does perform heavy lifting routinely at work: Denies inciting event, snap/pop/tearing sensation.  Patient denies recent injury or trauma, cough.  Patient denied left-sided chest pain, lightheadedness, dizziness, palpitations, difficulty breathing.  Patient does endorse worsening pain with full inspiration.  Patient has not tried anything for pain as he "does not like taking medications ".  Denies history of connective tissue disorder, cardiopulmonary disease/disorder, family history of young/sudden death.   Past Medical History:  Diagnosis Date  . Allergy     Patient Active Problem List   Diagnosis Date Noted  . Health maintenance examination 07/27/2012  . Counseling on substance use and abuse 07/27/2012  . Seasonal allergies 07/27/2012    Past Surgical History:  Procedure Laterality Date  . LEG SURGERY         Home Medications    Prior to Admission medications   Medication Sig Start Date End Date Taking? Authorizing Provider  acetaminophen (TYLENOL) 325 MG tablet Take 650 mg by mouth every 6 (six) hours as needed for pain.    [provider]  naproxen (NAPROSYN) 500 MG tablet Take 1 tablet (500 mg total) by mouth 2 (two) times daily. 06/11/19   Hall-Potvin, [place], PA-C  fluticasone (FLONASE) 50 MCG/ACT nasal spray Place 1 spray into both nostrils daily. 04/16/19 06/11/19  Hall-Potvin, [place], PA-C  loratadine (CLARITIN) 10 MG tablet Take 1 tablet (10 mg total) by mouth daily. 04/16/19 06/11/19  Hall-Potvin, [place], PA-C    Family History Family History  Problem Relation Age of  Onset  . Diabetes Maternal Aunt   . Diabetes Cousin   . Sickle cell anemia Cousin   . Sickle cell trait Cousin     Social History Social History   Tobacco Use  . Smoking status: Current Every Day Smoker    Packs/day: 1.00    Types: Cigars  . Smokeless tobacco: Never Used  Substance Use Topics  . Alcohol use: Yes    Alcohol/week: 8.0 standard drinks    Types: 7 Cans of beer, 1 Shots of liquor per week    Comment: Beer, occasionally liquor  . Drug use: No     Allergies   Patient has no known allergies.   Review of Systems As per HPI   Physical Exam Triage Vital Signs ED Triage Vitals  Enc Vitals Group     BP 06/11/19 1427 134/81     Pulse Rate 06/11/19 1427 69     Resp 06/11/19 1427 16     Temp 06/11/19 1427 98 F (36.7 C)     Temp Source 06/11/19 1427 Temporal     SpO2 06/11/19 1427 98 %     Weight --      Height --      Head Circumference --      Peak Flow --      Pain Score 06/11/19 1428 6     Pain Loc --      Pain Edu? --      Excl. in [place]? --    No data found.  Updated Vital Signs BP  134/81 (BP Location: Left Arm)   Pulse 69   Temp 98 F (36.7 C) (Temporal)   Resp 16   SpO2 98%   Visual Acuity Right Eye Distance:   Left Eye Distance:   Bilateral Distance:    Right Eye Near:   Left Eye Near:    Bilateral Near:     Physical Exam Constitutional:      General: He is not in acute distress.    Appearance: He is well-developed. He is not ill-appearing or diaphoretic.  HENT:     Head: Normocephalic and atraumatic.  Eyes:     General: No scleral icterus.    Pupils: Pupils are equal, round, and reactive to light.  Neck:     Vascular: No JVD.     Trachea: No tracheal deviation.  Cardiovascular:     Rate and Rhythm: Normal rate and regular rhythm.     Chest Wall: PMI is not displaced.     Heart sounds: Normal heart sounds.  Pulmonary:     Effort: Pulmonary effort is normal. No tachypnea, accessory muscle usage or respiratory distress.      Breath sounds: Normal breath sounds. No stridor. No wheezing.  Chest:     Chest wall: Tenderness present. No mass, deformity or crepitus.     Comments: Left lower costal margin TTP, midclavicular line Musculoskeletal:     Cervical back: Normal range of motion and neck supple.  Skin:    Coloration: Skin is not jaundiced or pale.  Neurological:     Mental Status: He is alert and oriented to person, place, and time.      UC Treatments / Results  Labs (all labs ordered are listed, but only abnormal results are displayed) Labs Reviewed - No data to display  EKG   Radiology No results found.  Procedures Procedures (including critical care time)  Medications Ordered in UC Medications - No data to display  Initial Impression / Assessment and Plan / UC Course  I have reviewed the triage vital signs and the nursing notes.  Pertinent labs & imaging results that were available during my care of the patient were reviewed by me and considered in my medical decision making (see chart for details).     Patient afebrile, nontoxic, and without respiratory distress.  Cardiopulmonary exam reassuring/no mechanism of injury: Radiography deferred at this time.  Likely musculoskeletal second to overuse.  Will trial NSAID, light duty x1 week.  Work note provided.  Return precautions discussed, patient verbalized understanding and is agreeable to plan. Final Clinical Impressions(s) / UC Diagnoses   Final diagnoses:  Costal margin pain     Discharge Instructions     Recommend RICE: rest, ice, compression, elevation as needed for pain.    Heat therapy (hot compress, warm wash red, hot showers, etc.) can help relax muscles and soothe muscle aches. Cold therapy (ice packs) can be used to help swelling both after injury and after prolonged use of areas of chronic pain/aches.  For pain: recommend 350 mg-1000 mg of Tylenol (acetaminophen) and/or 200 mg - 800 mg of Advil (ibuprofen, Motrin) every  8 hours as needed.  May alternate between the two throughout the day as they are generally safe to take together.  DO NOT exceed more than 3000 mg of Tylenol or 3200 mg of ibuprofen in a 24 hour period as this could damage your stomach, kidneys, liver, or increase your bleeding risk.  May take muscle relaxer as needed for severe pain / spasm.  (  This medication may cause you to become tired so it is important you do not drink alcohol or operate heavy machinery while on this medication.  Recommend your first dose to be taken before bedtime to monitor for side effects safely)  Return for worsening pain, bruising, difficulty breathing, lightheadedness.    ED Prescriptions    Medication Sig Dispense Auth. Provider   naproxen (NAPROSYN) 500 MG tablet Take 1 tablet (500 mg total) by mouth 2 (two) times daily. 30 tablet Hall-Potvin, [place], PA-C     PDMP not reviewed this encounter.   Hall-Potvin, [place], [place] 06/11/19 1458

## 2019-08-26 ENCOUNTER — Encounter: Payer: Self-pay | Admitting: Emergency Medicine

## 2019-08-26 ENCOUNTER — Ambulatory Visit
Admission: EM | Admit: 2019-08-26 | Discharge: 2019-08-26 | Disposition: A | Discharge status: Home / Self Care | Payer: 59 | Attending: Physician Assistant | Admitting: Physician Assistant

## 2019-08-26 ENCOUNTER — Other Ambulatory Visit: Payer: Self-pay

## 2019-08-26 DIAGNOSIS — M545 Low back pain, unspecified: Secondary | ICD-10-CM

## 2019-08-26 DIAGNOSIS — M546 Pain in thoracic spine: Secondary | ICD-10-CM

## 2019-08-26 MED ORDER — DICLOFENAC SODIUM 50 MG PO TBEC: 50.0000 mg | DELAYED_RELEASE_TABLET | Freq: Two times a day (BID) | ORAL | 0 refills | Status: AC

## 2019-08-26 MED ORDER — METHOCARBAMOL 500 MG PO TABS: 500.0000 mg | ORAL_TABLET | Freq: Two times a day (BID) | ORAL | 0 refills | Status: AC

## 2019-08-26 NOTE — Discharge Instructions (Signed)
Start diclofenac. Do not take ibuprofen (motrin/advil)/ naproxen (aleve) while on diclofenac. Robaxin as needed, this can make you drowsy, so do not take if you are going to drive, operate heavy machinery, or make important decisions. Ice/heat compresses as needed. This can take up to 3-4 weeks to completely resolve, but you should be feeling better each week. Follow up with PCP/orthopedics if symptoms worsen, changes for reevaluation. If experience numbness/tingling of the inner thighs, loss of bladder or bowel control, go to the emergency department for evaluation.

## 2019-08-26 NOTE — ED Provider Notes (Signed)
[facility]    CSN: [id number] Arrival date & time: 08/26/19  1619      History   Chief Complaint Chief Complaint  Patient presents with  . Back Pain    HPI CHISTIAN KASLER is a 29 y.o. male.   29 year old male comes in for bilateral upper and lower back pain for the past few days.  Denies injury/trauma.  Work requires heavy lifting, repetitive motion, and wonders if this is causing the pain.  Pain worse with range of motion without radiation of pain.  Denies numbness, tingling.  Denies loss of grip strength.  Denies saddle anesthesia, loss of bladder or bowel control.      Past Medical History:  Diagnosis Date  . Allergy     Patient Active Problem List   Diagnosis Date Noted  . Health maintenance examination 07/27/2012  . Counseling on substance use and abuse 07/27/2012  . Seasonal allergies 07/27/2012    Past Surgical History:  Procedure Laterality Date  . LEG SURGERY         Home Medications    Prior to Admission medications   Medication Sig Start Date End Date Taking? Authorizing Provider  diclofenac (VOLTAREN) 50 MG EC tablet Take 1 tablet (50 mg total) by mouth 2 (two) times daily. 08/26/19   Cathie Hoops, Haniah Penny V, PA-C  methocarbamol (ROBAXIN) 500 MG tablet Take 1 tablet (500 mg total) by mouth 2 (two) times daily. 08/26/19   Cathie Hoops, Gregor Dershem V, PA-C  fluticasone (FLONASE) 50 MCG/ACT nasal spray Place 1 spray into both nostrils daily. 04/16/19 06/11/19  Hall-Potvin, [place], PA-C  loratadine (CLARITIN) 10 MG tablet Take 1 tablet (10 mg total) by mouth daily. 04/16/19 06/11/19  Hall-Potvin, [place], PA-C    Family History Family History  Problem Relation Age of Onset  . Diabetes Maternal Aunt   . Diabetes Cousin   . Sickle cell anemia Cousin   . Sickle cell trait Cousin     Social History Social History   Tobacco Use  . Smoking status: Current Every Day Smoker    Packs/day: 1.00    Types: Cigars  . Smokeless tobacco: Never Used  Substance Use Topics  .  Alcohol use: Yes    Alcohol/week: 8.0 standard drinks    Types: 7 Cans of beer, 1 Shots of liquor per week    Comment: Beer, occasionally liquor  . Drug use: No     Allergies   Patient has no known allergies.   Review of Systems Review of Systems  Reason unable to perform ROS: See HPI as above.     Physical Exam Triage Vital Signs ED Triage Vitals  Enc Vitals Group     BP 08/26/19 1652 127/84     Pulse Rate 08/26/19 1652 63     Resp 08/26/19 1652 16     Temp 08/26/19 1652 98 F (36.7 C)     Temp Source 08/26/19 1652 Oral     SpO2 08/26/19 1652 99 %     Weight --      Height --      Head Circumference --      Peak Flow --      Pain Score 08/26/19 1701 7     Pain Loc --      Pain Edu? --      Excl. in [place]? --    No data found.  Updated Vital Signs BP 127/84 (BP Location: Left Arm)   Pulse 63   Temp 98 F (36.7  C) (Oral)   Resp 16   SpO2 99%   Physical Exam Constitutional:      General: He is not in acute distress.    Appearance: He is well-developed. He is not diaphoretic.  HENT:     Head: Normocephalic and atraumatic.  Eyes:     Conjunctiva/sclera: Conjunctivae normal.     Pupils: Pupils are equal, round, and reactive to light.  Cardiovascular:     Rate and Rhythm: Normal rate and regular rhythm.     Heart sounds: Normal heart sounds. No murmur. No friction rub. No gallop.   Pulmonary:     Effort: Pulmonary effort is normal. No accessory muscle usage or respiratory distress.     Breath sounds: Normal breath sounds. No stridor. No decreased breath sounds, wheezing, rhonchi or rales.  Musculoskeletal:     Comments: No tenderness on palpation of the spinous processes.  Diffuse tenderness to palpation of thoracic and lumbar back, though max tenderness to middle bilateral trapezius area.  Full range of motion BUE, neck, back. Strength normal and equal bilaterally. Sensation intact and equal bilaterally. Radial pulses 2+ and equal bilaterally.   Skin:     General: Skin is warm and dry.  Neurological:     Mental Status: He is alert and oriented to person, place, and time.    UC Treatments / Results  Labs (all labs ordered are listed, but only abnormal results are displayed) Labs Reviewed - No data to display  EKG   Radiology No results found.  Procedures Procedures (including critical care time)  Medications Ordered in UC Medications - No data to display  Initial Impression / Assessment and Plan / UC Course  I have reviewed the triage vital signs and the nursing notes.  Pertinent labs & imaging results that were available during my care of the patient were reviewed by me and considered in my medical decision making (see chart for details).    NSAID as directed. Muscle relaxant as needed. Ice/heat compresses. Expected course of healing discussed. Return precautions given.   Final Clinical Impressions(s) / UC Diagnoses   Final diagnoses:  Acute bilateral thoracic back pain  Acute bilateral low back pain without sciatica    ED Prescriptions    Medication Sig Dispense Auth. Provider   diclofenac (VOLTAREN) 50 MG EC tablet Take 1 tablet (50 mg total) by mouth 2 (two) times daily. 20 tablet Paolina Karwowski V, PA-C   methocarbamol (ROBAXIN) 500 MG tablet Take 1 tablet (500 mg total) by mouth 2 (two) times daily. 20 tablet Ok Edwards, PA-C     PDMP not reviewed this encounter.   Ok Edwards, PA-C 08/26/19 1730

## 2019-08-26 NOTE — ED Triage Notes (Signed)
Patient has pain in right shoulder blade and lower back.  No known injury.  Patient does repetitive motions at work, turning and lifting

## 2020-04-04 IMAGING — CR DG CHEST 2V
2 series · 2 of 2 positions shown · non-contrast
Comparison: October 02, 2015

CLINICAL DATA: Chest pain and shortness of breath

EXAM:
CHEST - 2 VIEW

[chest pa]
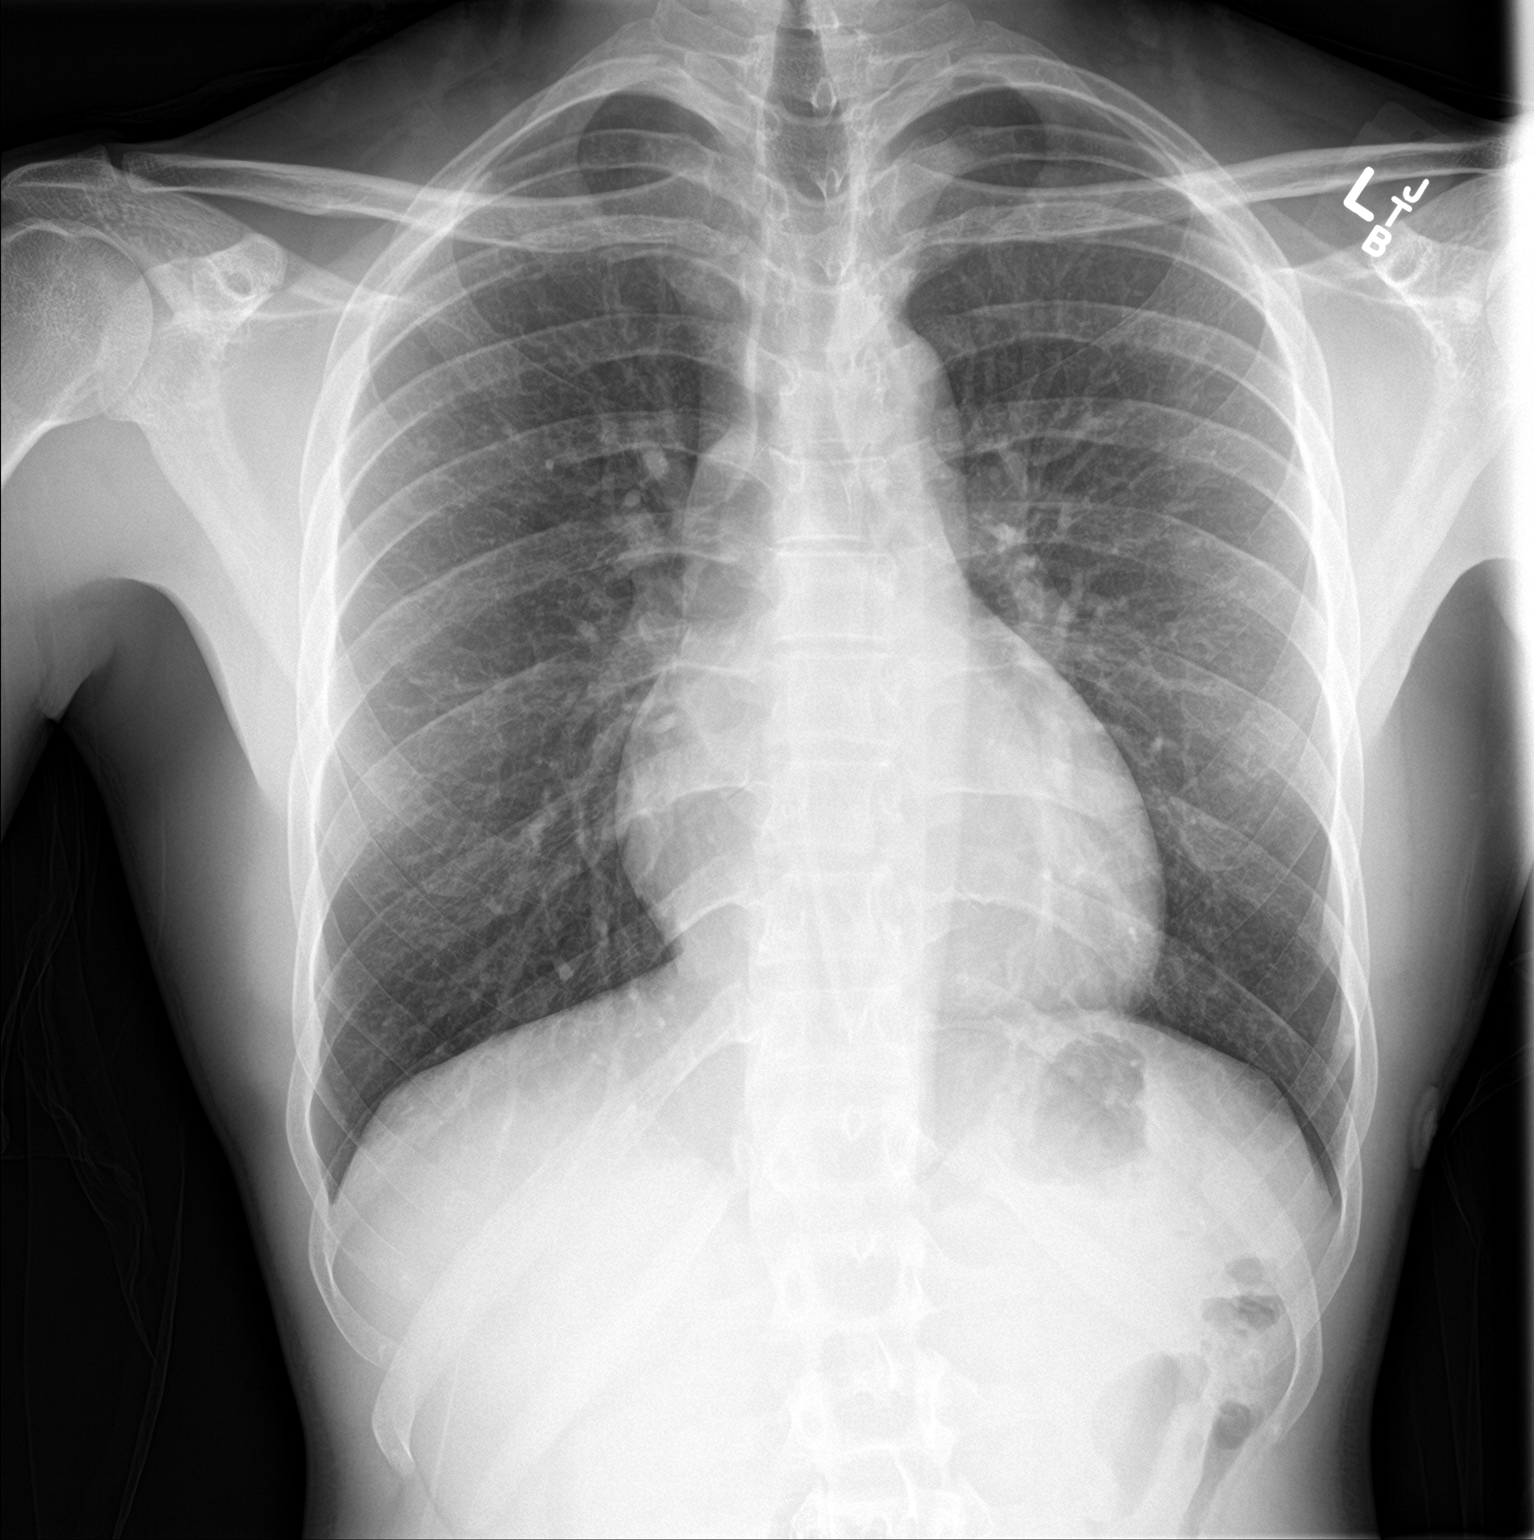

[chest lat]
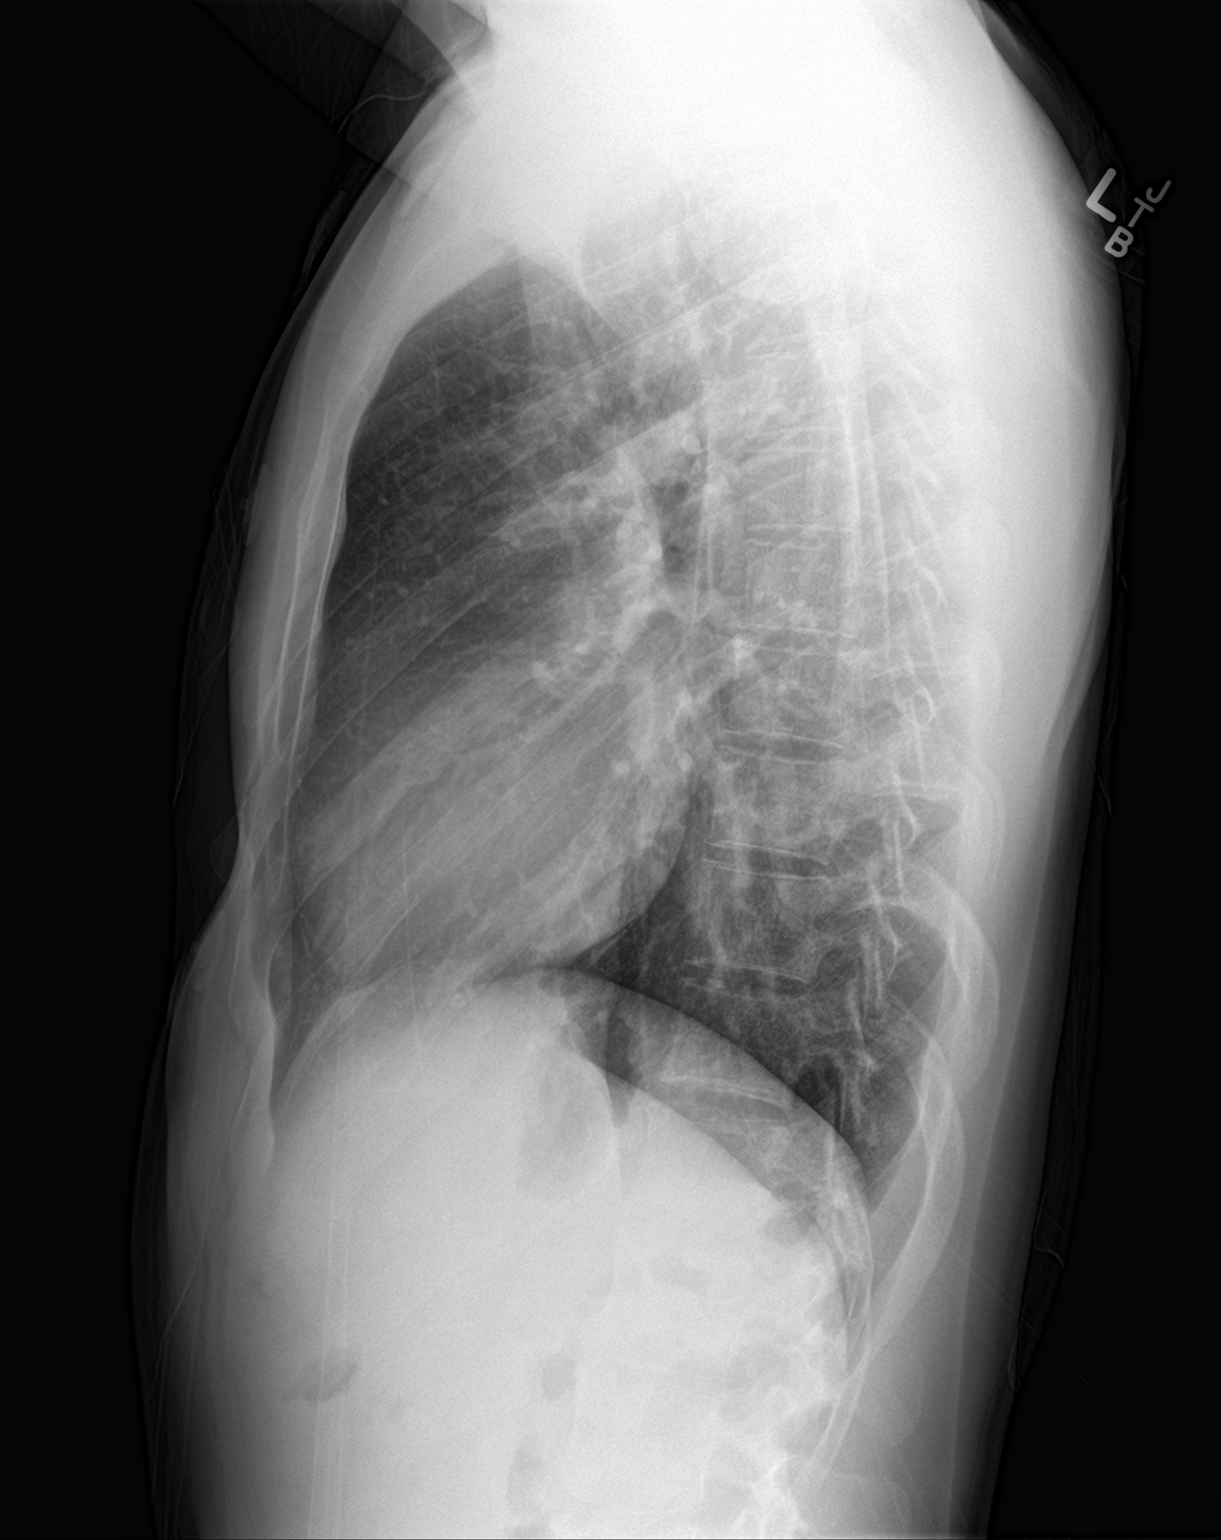

[2 of 2 positions shown; findings below may reference images not displayed]

FINDINGS: The lungs are clear. Heart size and pulmonary vascularity are
normal. No adenopathy. No pneumothorax. No bone lesions.
IMPRESSION: No edema or consolidation.
